# Patient Record
Sex: Female | Born: 1965 | Race: White | Hispanic: No | Marital: Married | Smoking: Never smoker
Health system: Southern US, Community
[De-identification: ages and names within clinical notes are randomized; demographics above are authoritative.]

## PROBLEM LIST (undated history)

## (undated) HISTORY — PX: FOOT SURGERY: SHX648

---

## 2002-03-23 HISTORY — PX: BREAST EXCISIONAL BIOPSY: SUR124

## 2017-02-13 ENCOUNTER — Other Ambulatory Visit (HOSPITAL_BASED_OUTPATIENT_CLINIC_OR_DEPARTMENT_OTHER): Payer: Self-pay | Admitting: Family Medicine

## 2017-02-13 DIAGNOSIS — Z1239 Encounter for other screening for malignant neoplasm of breast: Secondary | ICD-10-CM

## 2017-03-04 ENCOUNTER — Ambulatory Visit (HOSPITAL_BASED_OUTPATIENT_CLINIC_OR_DEPARTMENT_OTHER)
Admission: RE | Admit: 2017-03-04 | Discharge: 2017-03-04 | Disposition: A | Discharge status: Home / Self Care | Payer: BLUE CROSS/BLUE SHIELD | Source: Ambulatory Visit | Attending: Family Medicine | Admitting: Family Medicine

## 2017-03-04 ENCOUNTER — Encounter (HOSPITAL_BASED_OUTPATIENT_CLINIC_OR_DEPARTMENT_OTHER): Payer: Self-pay

## 2017-03-04 DIAGNOSIS — Z1239 Encounter for other screening for malignant neoplasm of breast: Secondary | ICD-10-CM

## 2017-03-04 DIAGNOSIS — Z1231 Encounter for screening mammogram for malignant neoplasm of breast: Secondary | ICD-10-CM | POA: Diagnosis not present

## 2017-07-01 ENCOUNTER — Encounter (HOSPITAL_BASED_OUTPATIENT_CLINIC_OR_DEPARTMENT_OTHER): Payer: Self-pay

## 2017-07-01 ENCOUNTER — Emergency Department (HOSPITAL_BASED_OUTPATIENT_CLINIC_OR_DEPARTMENT_OTHER): Payer: No Typology Code available for payment source

## 2017-07-01 ENCOUNTER — Emergency Department (HOSPITAL_BASED_OUTPATIENT_CLINIC_OR_DEPARTMENT_OTHER)
Admission: EM | Admit: 2017-07-01 | Discharge: 2017-07-01 | Disposition: A | Discharge status: Home / Self Care | Payer: No Typology Code available for payment source | Attending: Emergency Medicine | Admitting: Emergency Medicine

## 2017-07-01 ENCOUNTER — Other Ambulatory Visit: Payer: Self-pay

## 2017-07-01 DIAGNOSIS — M549 Dorsalgia, unspecified: Secondary | ICD-10-CM | POA: Insufficient documentation

## 2017-07-01 DIAGNOSIS — R51 Headache: Secondary | ICD-10-CM | POA: Insufficient documentation

## 2017-07-01 DIAGNOSIS — Y929 Unspecified place or not applicable: Secondary | ICD-10-CM | POA: Insufficient documentation

## 2017-07-01 DIAGNOSIS — Y999 Unspecified external cause status: Secondary | ICD-10-CM | POA: Diagnosis not present

## 2017-07-01 DIAGNOSIS — S161XXA Strain of muscle, fascia and tendon at neck level, initial encounter: Secondary | ICD-10-CM | POA: Diagnosis not present

## 2017-07-01 DIAGNOSIS — Y939 Activity, unspecified: Secondary | ICD-10-CM | POA: Diagnosis not present

## 2017-07-01 DIAGNOSIS — S199XXA Unspecified injury of neck, initial encounter: Secondary | ICD-10-CM | POA: Diagnosis present

## 2017-07-01 MED ORDER — CYCLOBENZAPRINE HCL 10 MG PO TABS: 10.0000 mg | ORAL_TABLET | Freq: Two times a day (BID) | ORAL | 0 refills | Status: AC | PRN

## 2017-07-01 MED ORDER — ACETAMINOPHEN 500 MG PO TABS
1000.0000 mg | ORAL_TABLET | Freq: Once | ORAL | Status: AC
  Administered 2017-07-01: 1000 mg via ORAL
  Filled 2017-07-01: qty 2
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
## 2018-03-22 ENCOUNTER — Other Ambulatory Visit (HOSPITAL_BASED_OUTPATIENT_CLINIC_OR_DEPARTMENT_OTHER): Payer: Self-pay | Admitting: Family Medicine

## 2018-03-22 DIAGNOSIS — Z1231 Encounter for screening mammogram for malignant neoplasm of breast: Secondary | ICD-10-CM

## 2018-03-25 ENCOUNTER — Ambulatory Visit (HOSPITAL_BASED_OUTPATIENT_CLINIC_OR_DEPARTMENT_OTHER)
Admission: RE | Admit: 2018-03-25 | Discharge: 2018-03-25 | Disposition: A | Discharge status: Home / Self Care | Payer: Managed Care, Other (non HMO) | Source: Ambulatory Visit | Attending: Family Medicine | Admitting: Family Medicine

## 2018-03-25 DIAGNOSIS — Z1231 Encounter for screening mammogram for malignant neoplasm of breast: Secondary | ICD-10-CM | POA: Diagnosis present

## 2019-02-27 ENCOUNTER — Other Ambulatory Visit (HOSPITAL_BASED_OUTPATIENT_CLINIC_OR_DEPARTMENT_OTHER): Payer: Self-pay | Admitting: Family Medicine

## 2019-02-27 DIAGNOSIS — Z1231 Encounter for screening mammogram for malignant neoplasm of breast: Secondary | ICD-10-CM

## 2019-03-28 ENCOUNTER — Ambulatory Visit (HOSPITAL_BASED_OUTPATIENT_CLINIC_OR_DEPARTMENT_OTHER)
Admission: RE | Admit: 2019-03-28 | Discharge: 2019-03-28 | Disposition: A | Discharge status: Home / Self Care | Payer: Managed Care, Other (non HMO) | Source: Ambulatory Visit | Attending: Family Medicine | Admitting: Family Medicine

## 2019-03-28 ENCOUNTER — Encounter (HOSPITAL_BASED_OUTPATIENT_CLINIC_OR_DEPARTMENT_OTHER): Payer: Self-pay

## 2019-03-28 ENCOUNTER — Other Ambulatory Visit: Payer: Self-pay

## 2019-03-28 DIAGNOSIS — Z1231 Encounter for screening mammogram for malignant neoplasm of breast: Secondary | ICD-10-CM | POA: Diagnosis not present

## 2020-03-12 ENCOUNTER — Other Ambulatory Visit: Payer: Self-pay | Admitting: Family Medicine

## 2020-03-12 DIAGNOSIS — Z1231 Encounter for screening mammogram for malignant neoplasm of breast: Secondary | ICD-10-CM

## 2020-05-01 ENCOUNTER — Other Ambulatory Visit: Payer: Self-pay

## 2020-05-01 ENCOUNTER — Ambulatory Visit (INDEPENDENT_AMBULATORY_CARE_PROVIDER_SITE_OTHER): Payer: 59

## 2020-05-01 DIAGNOSIS — Z1231 Encounter for screening mammogram for malignant neoplasm of breast: Secondary | ICD-10-CM

## 2021-01-27 ENCOUNTER — Ambulatory Visit: Payer: 59 | Attending: Internal Medicine

## 2021-01-27 DIAGNOSIS — Z23 Encounter for immunization: Secondary | ICD-10-CM

## 2021-01-27 NOTE — Progress Notes (Signed)
   Covid-19 Vaccination Clinic  Name:  Laveta Noseworthy    MRN: [id number] DOB: [birth year]  01/27/2021  Ms. Minix was observed post Covid-19 immunization for 15 minutes without incident. She was provided with Vaccine Information Sheet and instruction to access the V-Safe system.   Ms. Lea was instructed to call 911 with any severe reactions post vaccine: Difficulty breathing  Swelling of face and throat  A fast heartbeat  A bad rash all over body  Dizziness and weakness   Immunizations Administered     Name Date Dose VIS Date Route   Pfizer Covid-19 Vaccine Bivalent Booster 01/27/2021  9:38 AM 0.3 mL 11/20/2020 Intramuscular   Manufacturer: [employer], [employer]   Lot: [id number]   NDC: [phone]

## 2021-02-11 ENCOUNTER — Other Ambulatory Visit (HOSPITAL_BASED_OUTPATIENT_CLINIC_OR_DEPARTMENT_OTHER): Payer: Self-pay

## 2021-02-11 MED ORDER — PFIZER COVID-19 VAC BIVALENT 30 MCG/0.3ML IM SUSP
INTRAMUSCULAR | 0 refills | Status: AC
  Filled 2021-02-11: qty 0.3, 1d supply, fill #0

## 2021-04-22 ENCOUNTER — Other Ambulatory Visit (HOSPITAL_BASED_OUTPATIENT_CLINIC_OR_DEPARTMENT_OTHER): Payer: Self-pay | Admitting: Family Medicine

## 2021-04-22 DIAGNOSIS — Z1231 Encounter for screening mammogram for malignant neoplasm of breast: Secondary | ICD-10-CM

## 2021-05-05 ENCOUNTER — Encounter (HOSPITAL_BASED_OUTPATIENT_CLINIC_OR_DEPARTMENT_OTHER): Payer: 59

## 2021-05-05 DIAGNOSIS — Z1231 Encounter for screening mammogram for malignant neoplasm of breast: Secondary | ICD-10-CM

## 2021-05-12 ENCOUNTER — Ambulatory Visit (HOSPITAL_BASED_OUTPATIENT_CLINIC_OR_DEPARTMENT_OTHER): Payer: 59

## 2021-05-13 ENCOUNTER — Ambulatory Visit (HOSPITAL_BASED_OUTPATIENT_CLINIC_OR_DEPARTMENT_OTHER)
Admission: RE | Admit: 2021-05-13 | Discharge: 2021-05-13 | Disposition: A | Discharge status: Home / Self Care | Payer: 59 | Source: Ambulatory Visit | Attending: Family Medicine | Admitting: Family Medicine

## 2021-05-13 ENCOUNTER — Other Ambulatory Visit: Payer: Self-pay

## 2021-05-13 ENCOUNTER — Encounter (HOSPITAL_BASED_OUTPATIENT_CLINIC_OR_DEPARTMENT_OTHER): Payer: Self-pay

## 2021-05-13 DIAGNOSIS — Z1231 Encounter for screening mammogram for malignant neoplasm of breast: Secondary | ICD-10-CM | POA: Diagnosis not present

## 2022-02-20 ENCOUNTER — Other Ambulatory Visit (HOSPITAL_BASED_OUTPATIENT_CLINIC_OR_DEPARTMENT_OTHER): Payer: Self-pay

## 2022-02-20 MED ORDER — COMIRNATY 30 MCG/0.3ML IM SUSY
PREFILLED_SYRINGE | INTRAMUSCULAR | 0 refills | Status: AC
  Filled 2022-02-20: qty 0.3, 1d supply, fill #0

## 2022-02-24 ENCOUNTER — Other Ambulatory Visit (HOSPITAL_BASED_OUTPATIENT_CLINIC_OR_DEPARTMENT_OTHER): Payer: Self-pay

## 2022-03-30 ENCOUNTER — Other Ambulatory Visit (HOSPITAL_BASED_OUTPATIENT_CLINIC_OR_DEPARTMENT_OTHER): Payer: Self-pay | Admitting: Family Medicine

## 2022-03-30 DIAGNOSIS — Z1231 Encounter for screening mammogram for malignant neoplasm of breast: Secondary | ICD-10-CM

## 2022-05-09 IMAGING — MG MM DIGITAL SCREENING BILAT W/ TOMO AND CAD
8 series · 9 of 24 positions shown · non-contrast
Comparison: Previous exam(s).

CLINICAL DATA: Screening.

EXAM:
DIGITAL SCREENING BILATERAL MAMMOGRAM WITH TOMOSYNTHESIS AND CAD
TECHNIQUE: Bilateral screening digital craniocaudal and mediolateral oblique
mammograms were obtained. Bilateral screening digital breast
tomosynthesis was performed. The images were evaluated with
computer-aided detection.

[L CC synth-2D]
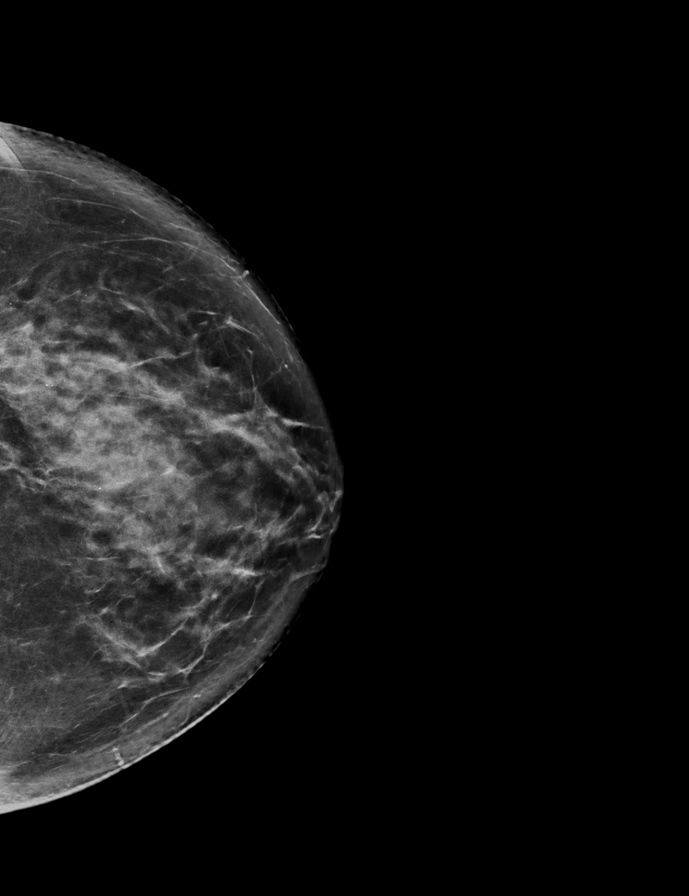

[R MLO synth-2D]
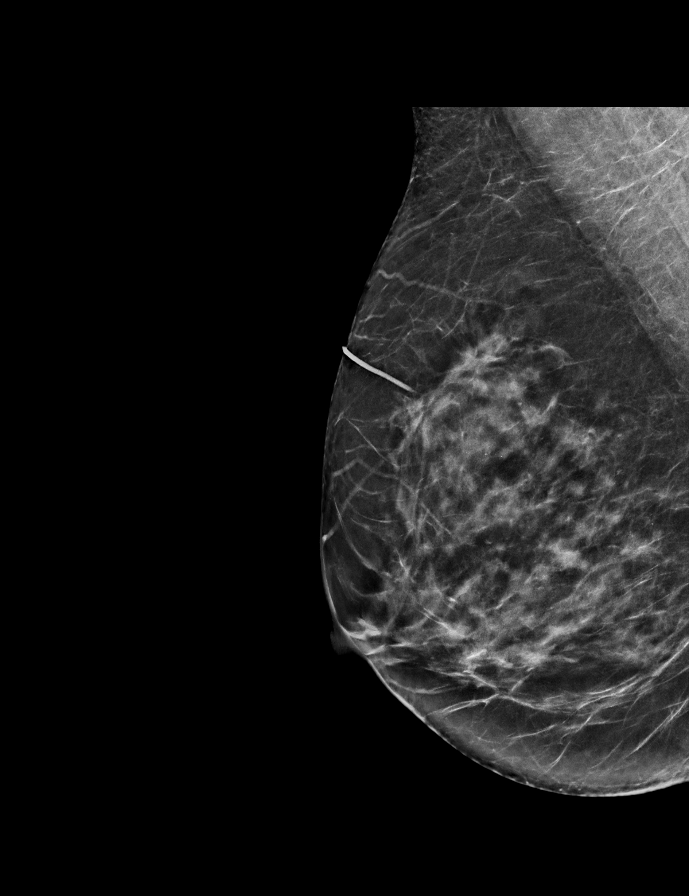

[L MLO synth-2D]
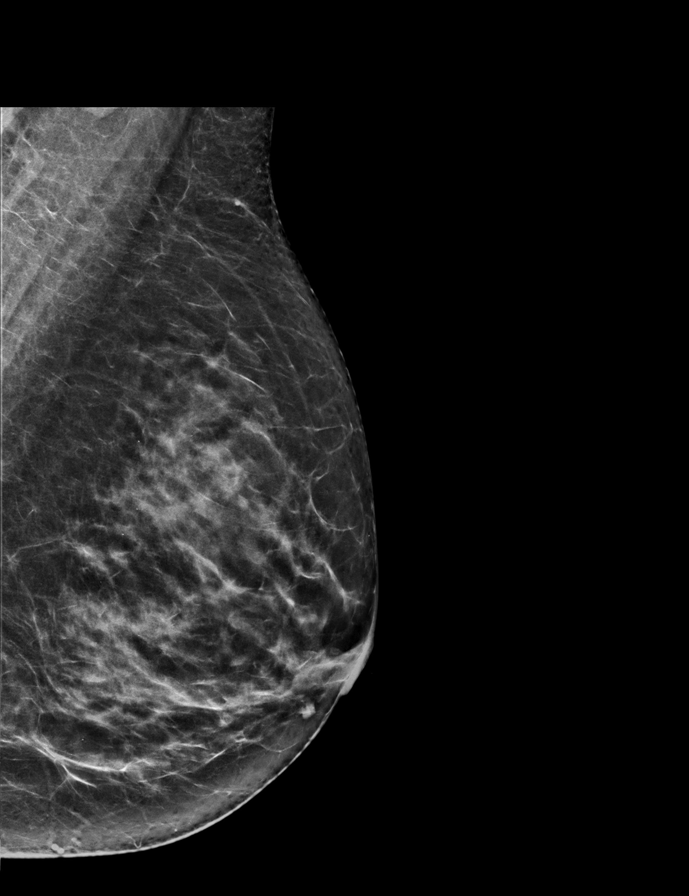

[R CC synth-2D]
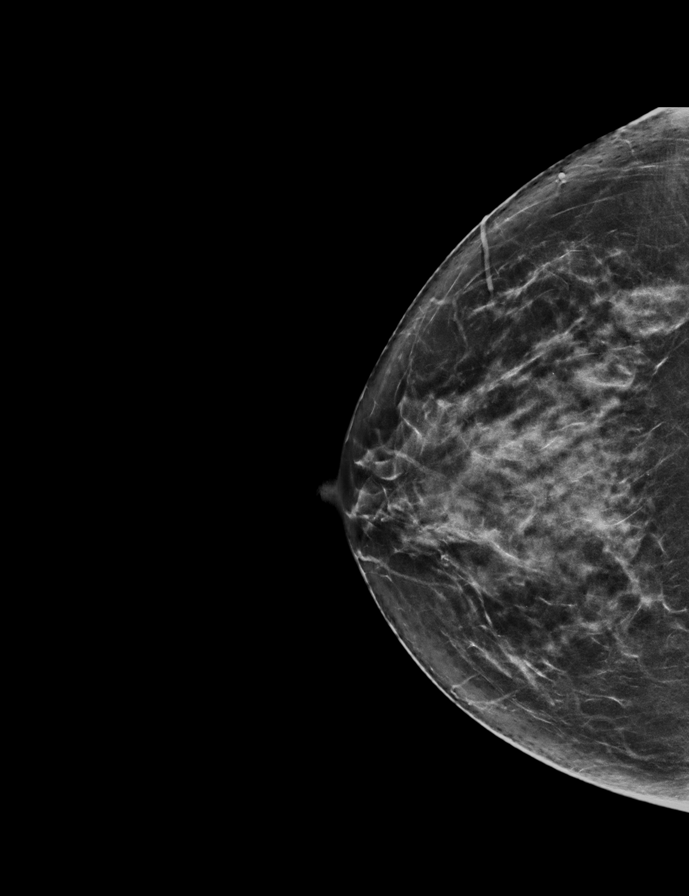

[L CC tomo · 2 of 76 frames shown]
[frame 25/76]
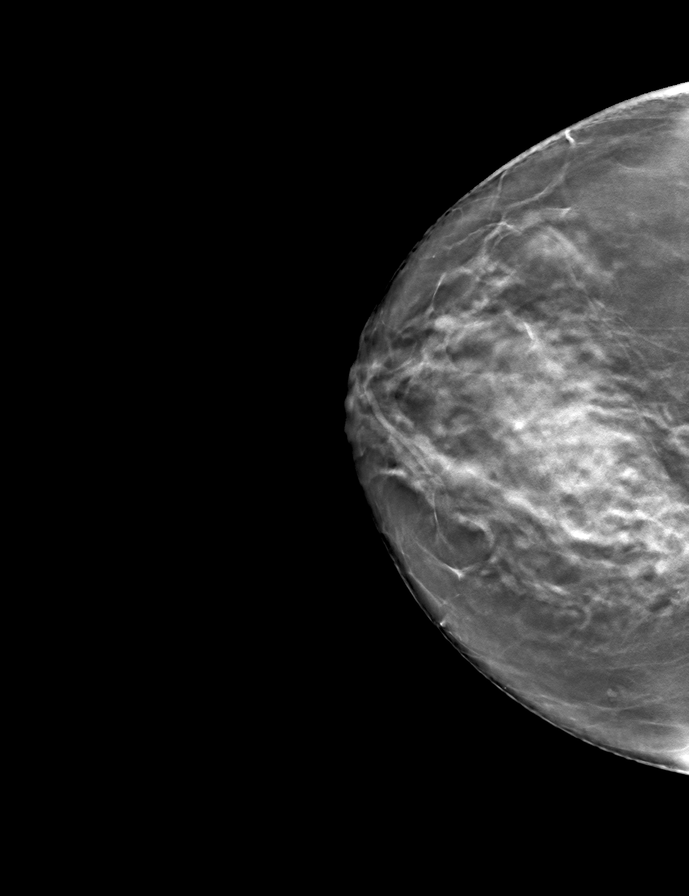
[frame 39/76]
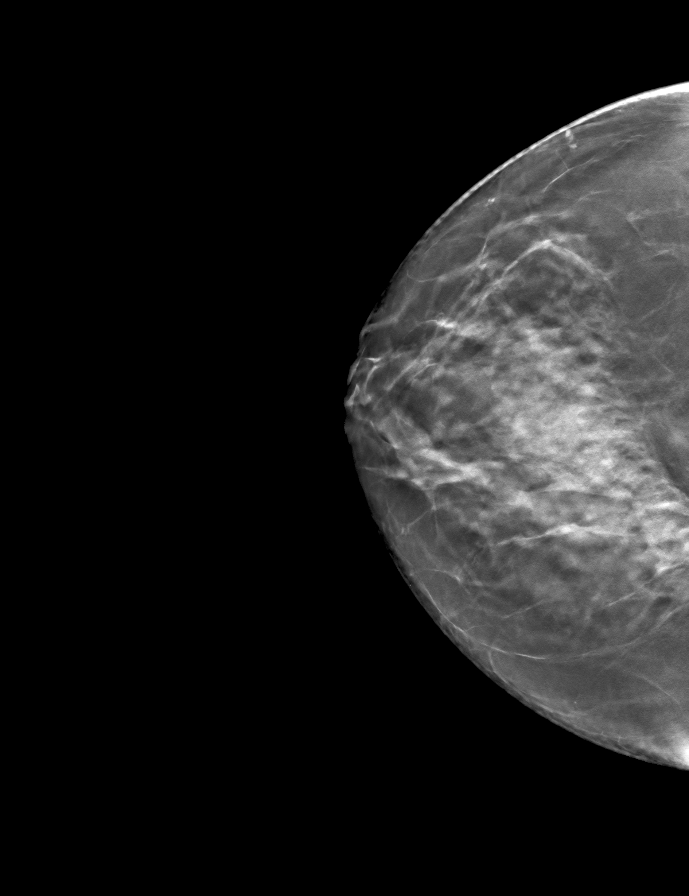

[L MLO tomo · tomo slice 35/70.0]
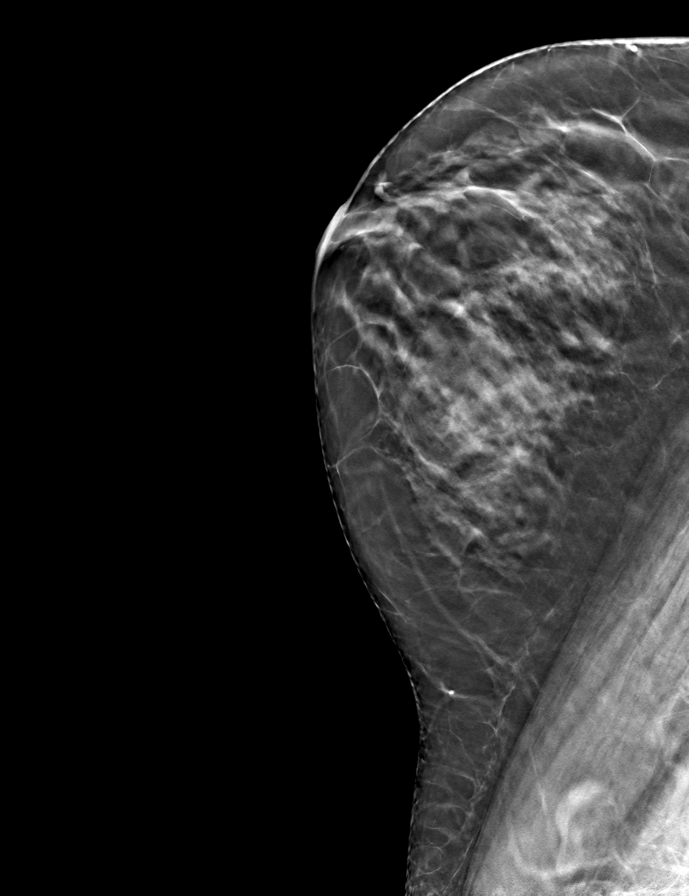

[R MLO tomo · tomo slice 34/67.0]
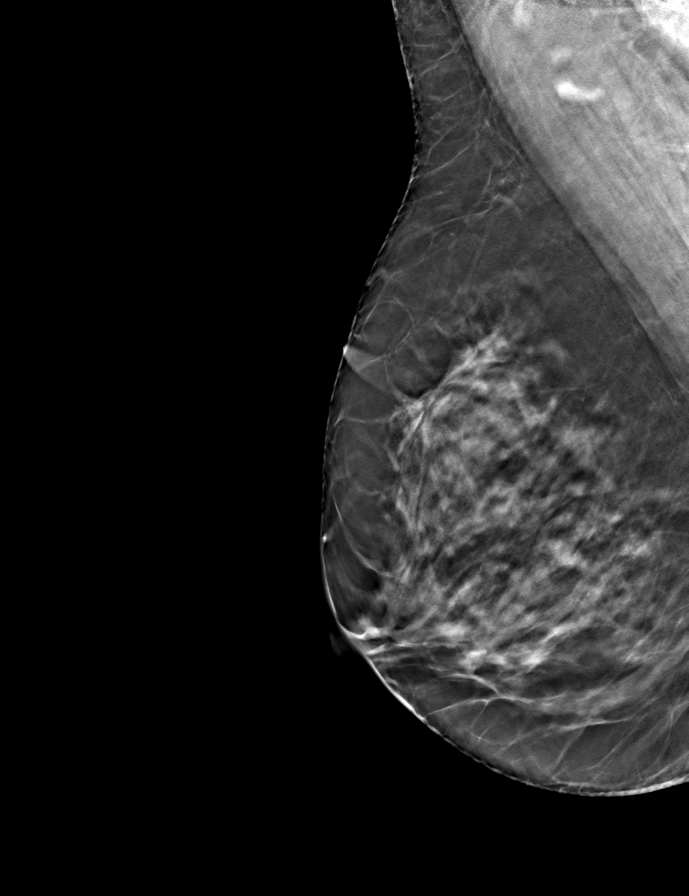

[R CC tomo · tomo slice 35/68.0]
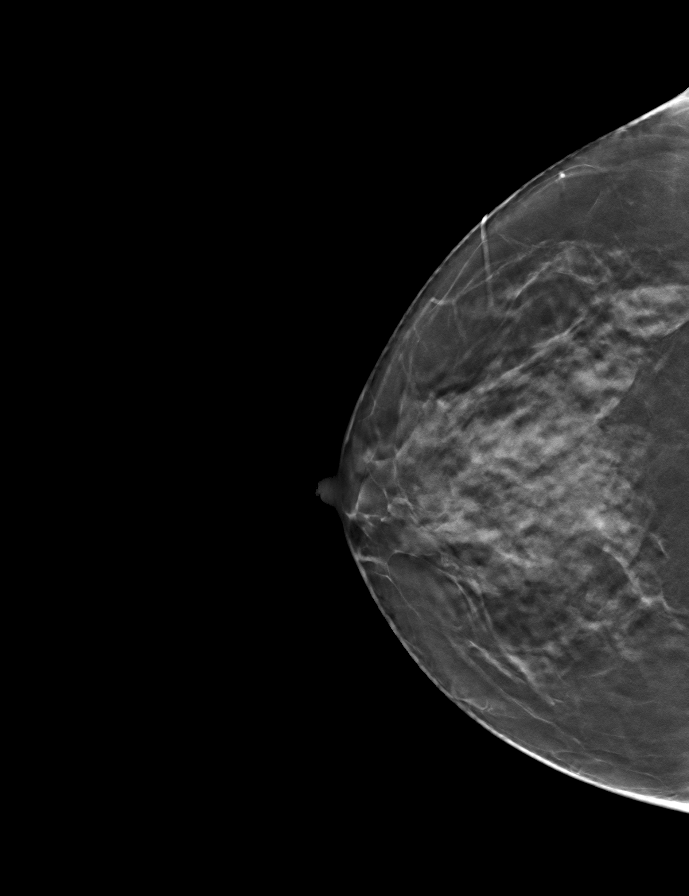

[9 of 24 positions shown; findings below may reference images not displayed]

ACR Breast Density Category c: The breast tissue is heterogeneously
dense, which may obscure small masses.
FINDINGS: There are no findings suspicious for malignancy.
IMPRESSION: No mammographic evidence of malignancy. A result letter of this
screening mammogram will be mailed directly to the patient.

RECOMMENDATION:
Screening mammogram in one year. (Code:Q3-W-BC3)

BI-RADS CATEGORY  1: Negative.

## 2022-05-19 ENCOUNTER — Encounter (HOSPITAL_BASED_OUTPATIENT_CLINIC_OR_DEPARTMENT_OTHER): Payer: Self-pay

## 2022-05-19 ENCOUNTER — Ambulatory Visit (HOSPITAL_BASED_OUTPATIENT_CLINIC_OR_DEPARTMENT_OTHER)
Admission: RE | Admit: 2022-05-19 | Discharge: 2022-05-19 | Disposition: A | Discharge status: Home / Self Care | Payer: 59 | Source: Ambulatory Visit | Attending: Family Medicine | Admitting: Family Medicine

## 2022-05-19 DIAGNOSIS — Z1231 Encounter for screening mammogram for malignant neoplasm of breast: Secondary | ICD-10-CM | POA: Diagnosis present

## 2023-04-27 ENCOUNTER — Other Ambulatory Visit: Payer: Self-pay | Admitting: Family Medicine

## 2023-04-27 DIAGNOSIS — Z1231 Encounter for screening mammogram for malignant neoplasm of breast: Secondary | ICD-10-CM

## 2023-05-21 DIAGNOSIS — Z1231 Encounter for screening mammogram for malignant neoplasm of breast: Secondary | ICD-10-CM
